# Patient Record
Sex: Female | Born: 1955 | Race: White | Hispanic: No | Marital: Married
Health system: Midwestern US, Academic
[De-identification: ages and names within clinical notes are randomized; demographics above are authoritative.]

---

## 2017-04-11 ENCOUNTER — Encounter: Admit: 2017-04-11 | Discharge: 2017-04-11 | Payer: Private Health Insurance - Indemnity

## 2017-07-25 ENCOUNTER — Encounter: Admit: 2017-07-25 | Discharge: 2017-07-25 | Payer: Private Health Insurance - Indemnity

## 2017-07-25 ENCOUNTER — Encounter: Admit: 2017-07-25 | Discharge: 2017-07-25 | Payer: Private health insurance—other commercial Indemnity

## 2017-07-25 DIAGNOSIS — Z1231 Encounter for screening mammogram for malignant neoplasm of breast: Principal | ICD-10-CM

## 2017-07-25 DIAGNOSIS — Z171 Estrogen receptor negative status [ER-]: ICD-10-CM

## 2017-07-25 DIAGNOSIS — N200 Calculus of kidney: ICD-10-CM

## 2017-07-25 DIAGNOSIS — J302 Other seasonal allergic rhinitis: ICD-10-CM

## 2017-07-25 DIAGNOSIS — C50911 Malignant neoplasm of unspecified site of right female breast: ICD-10-CM

## 2017-07-25 DIAGNOSIS — E119 Type 2 diabetes mellitus without complications: ICD-10-CM

## 2017-07-25 DIAGNOSIS — M549 Dorsalgia, unspecified: ICD-10-CM

## 2017-07-25 DIAGNOSIS — C50919 Malignant neoplasm of unspecified site of unspecified female breast: Principal | ICD-10-CM

## 2018-10-27 ENCOUNTER — Encounter: Admit: 2018-10-27 | Discharge: 2018-10-27

## 2018-10-30 ENCOUNTER — Encounter: Admit: 2018-10-30 | Discharge: 2018-10-30

## 2018-10-30 DIAGNOSIS — Z171 Estrogen receptor negative status [ER-]: Secondary | ICD-10-CM

## 2018-10-30 DIAGNOSIS — C50911 Malignant neoplasm of unspecified site of right female breast: Secondary | ICD-10-CM

## 2018-10-30 DIAGNOSIS — M25559 Pain in unspecified hip: Secondary | ICD-10-CM

## 2018-10-30 DIAGNOSIS — K2270 Barrett's esophagus without dysplasia: Secondary | ICD-10-CM

## 2018-10-30 DIAGNOSIS — Z1231 Encounter for screening mammogram for malignant neoplasm of breast: Secondary | ICD-10-CM

## 2018-10-30 DIAGNOSIS — R200 Anesthesia of skin: Secondary | ICD-10-CM

## 2018-10-30 DIAGNOSIS — E119 Type 2 diabetes mellitus without complications: Secondary | ICD-10-CM

## 2018-10-30 NOTE — Patient Instructions
Team Members:  Dr. Priyanka Sharma, MD  Jaimie Heldstab, APRN  Lisa Her, RN  Garrell Flagg, RN    Contact information:   Office phone: [phone]  Office fax: [phone]  After hours phone: [phone]    Address:  [address], [address]  [place], [place] [address]

## 2018-10-30 NOTE — Progress Notes
10/30/2018    Shelley Powell is a 63 y.o. female.    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   STAGE: T3N0Mx, high grade metaplastic breast cancer     Surgeon:  Dr. Fredricka Powell    Oncology History: 63 year old perimenopausal female states she initially felt a mass in her right breast during the holiday's of 2009 and brought it to the attention of her PCP. She had a mammogram and then a ultrasound read as BIRAD 4. 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 She denies any nipple discharge, skin changes, fatigue, or any other complaints. She was then seen by Dr Shelley Powell and a 10 cm mass was found in her right breast. She underwent right MRM ( with TE placement ) and sentinel LN sampling on 05/21/08. Pathology showed 5.3cm x 4.8cm x 3.9cm poorly differentiated cancer, with chondroid and osseous differentiation. All margins were clear. No angiolymphatic invasion, 0/2 sentinel LNs were negative. ER/PR negative, Ki-67 80%, EGFR 100%, HER 2 negative by FISH. Adjuvant chemotherapy with Adriamycin and Cytoxan dose-dense was initiated on 06/19/2008 and completed 08/01/2008. Weekly Taxol 08/22/2008 and completed 11/07/2008. Has completed adjuvant XRT under the direction of Dr. Kendal Hymen Powell (01/10/2009).     PRESENT THERAPY: Observation     HPI  Shelley Powell presents today for follow up. She reports feeling well.      Review of Systems   Constitutional: Negative for fatigue and fever.   HENT: Negative for trouble swallowing.    Eyes: Negative.  Negative for visual disturbance.   Respiratory: Negative for cough, chest tightness and shortness of breath.    Cardiovascular: Negative for chest pain.   Gastrointestinal: Negative for abdominal pain, constipation, diarrhea, nausea and vomiting.   Genitourinary: Negative.  Negative for difficulty urinating.   Musculoskeletal: Positive for arthralgias (hip, stable). Negative for back pain.   Skin: Negative.  Negative for rash and wound.   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 Negative for dizziness, weakness and headaches.   Hematological: Negative.  Negative for adenopathy.   Psychiatric/Behavioral: Negative.  Negative for dysphoric mood. The patient is not nervous/anxious.        Current Medication List:         ??? glipiZIDE (GLUCOTROL) 10 mg tablet Take 10 mg by mouth twice daily.   ??? hydroCHLOROthiazide (HYDRODIURIL) 12.5 mg tablet Take 12.5 mg by mouth every morning.   ??? losartan (COZAAR) 50 mg tablet Take 50 mg by mouth daily.   ??? omeprazole DR (PRILOSEC) 40 mg capsule Take 40 mg by mouth daily before breakfast.   ??? pravastatin (PRAVACHOL) 40 mg tablet Take 40 mg by mouth daily.         Vitals:    10/30/18 1122   BP: 120/75   Pulse: 86   Resp: 16   Temp: 36.8 ???C (98.2 ???F)   SpO2: 98%       Objective     Body mass index is 33.51 kg/m???.     Pain Rating:   0       Pain Addressed:  N/A    ECOG performance status is 0, Fully active, able to carry on all pre-disease performance without restriction.Marland Kitchen    Physical Exam   Constitutional: She is oriented to person, place, and time. She appears well-developed and well-nourished. No distress.   HENT:  Head: Normocephalic and atraumatic.   Mouth/Throat: Oropharynx is clear and moist.   Eyes: Pupils are equal, round, and reactive to light. Conjunctivae are normal. Right eye exhibits no discharge. Left eye exhibits no discharge. No scleral icterus.   Neck: Normal range of motion. Neck supple. No JVD present.   Cardiovascular: Normal rate, regular rhythm and normal heart sounds.   No murmur heard.  Pulmonary/Chest: Effort normal and breath sounds normal. No respiratory distress. She has no wheezes.       Abdominal: Soft. She exhibits no distension. There is no abdominal tenderness.   Musculoskeletal: Normal range of motion.         General: No tenderness or edema.   Lymphadenopathy:     She has no cervical adenopathy. She has no axillary adenopathy.        Right: No supraclavicular adenopathy present.        Left: No supraclavicular adenopathy present.   Neurological: She is alert and oriented to person, place, and time. No cranial nerve deficit. Coordination normal.   Skin: Skin is warm and dry. No rash noted. No erythema.   Psychiatric: She has a normal mood and affect. Her behavior is normal. Judgment and thought content normal.   Vitals reviewed.       Breast Imaging:  Bilateral screen mamm TOMO 07/14/15: BIRAD 1  Left screening mamm 07/19/16: BIRAD 1  Left Screening Mammo 07/25/17: BIRAD 1  Left Screening Mammogram 10/30/18: BIRAD 1           Assessment and Plan:   1. T3N0M0 Metaplastic right breast CA; S/P mastectomy/TE. Completed adjuvant A/C followed by Weekly Taxol x 12 cycles (completed 11/07/2008). Patient then completed radiation therapy. NED, continue observation.      2. BRCA and BART testing negative. She is participating in the [facility] registry.     3. DM: Currently on glipizide.  Is followed by [facility].    4.  Colonoscopy:  Done 08/2015, repeat in 10 years.     5. 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 She is on a PPI and follows with GI in [place].  Plans to have repeat EDG this year, locally.     6.Reviewed signs to watch for that could indicate a local or distant recurrence. Patient will call our office with any new signs.     7. Current mammogram BIRAD 1, repeat in 1 year.     8. Continue follow up with PCP for HTN, HLD.     RTC in 1 year with left mammogram      Francie Massing, APRN-NP    Collaborating MD: Osborn Coho

## 2019-02-01 ENCOUNTER — Encounter: Admit: 2019-02-01 | Discharge: 2019-02-01 | Payer: Private Health Insurance - Indemnity

## 2019-02-01 ENCOUNTER — Ambulatory Visit: Admit: 2019-02-01 | Discharge: 2019-02-01 | Payer: Private Health Insurance - Indemnity

## 2019-02-01 DIAGNOSIS — R42 Dizziness and giddiness: Secondary | ICD-10-CM

## 2019-08-17 IMAGING — CT ABDOMEN_PELVIS WO(Adult)
2 of 3 series · 13 of 46 positions shown, 15 images · non-contrast
Comparison: none

[Series 2: abdomen_pelvis ax 3.00 br40 s3 · axial · 0.68mm/px · z∈[+1171,+1597]mm · 10 of 160 slices shown, 12 images]
[im 11/160  soft-tissue]
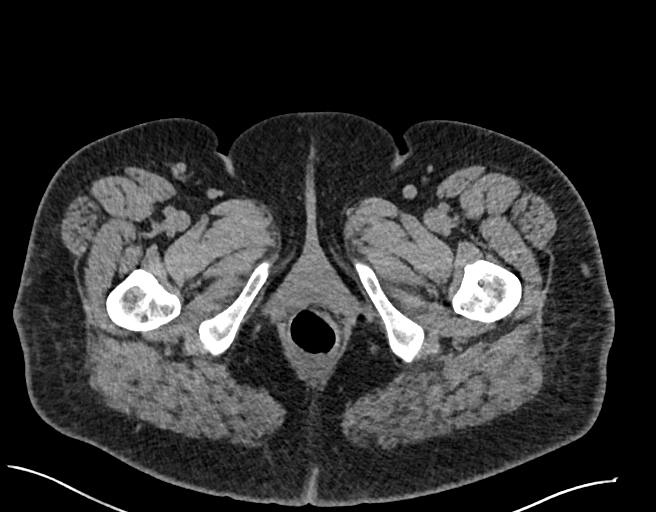
[im 11/160  bone]
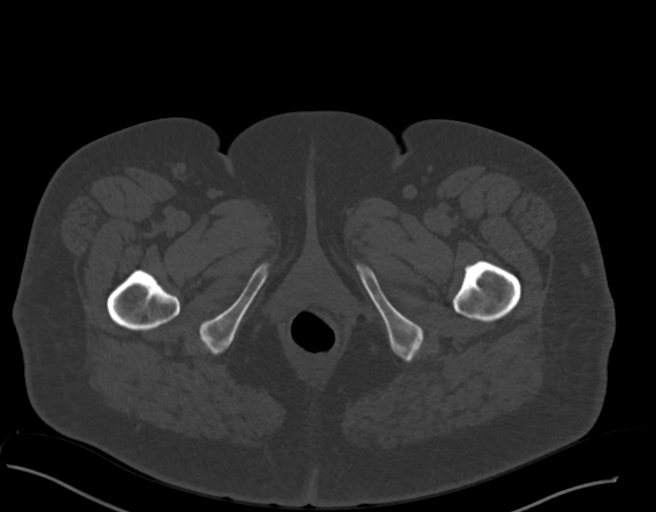
[im 26/160  soft-tissue]
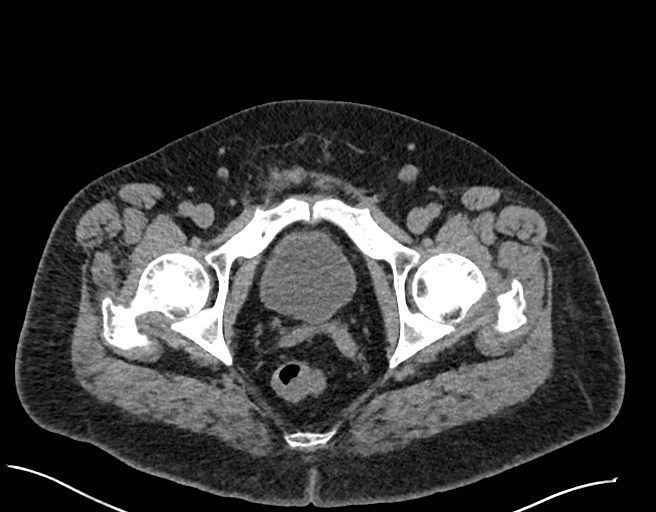
[im 42/160  soft-tissue]
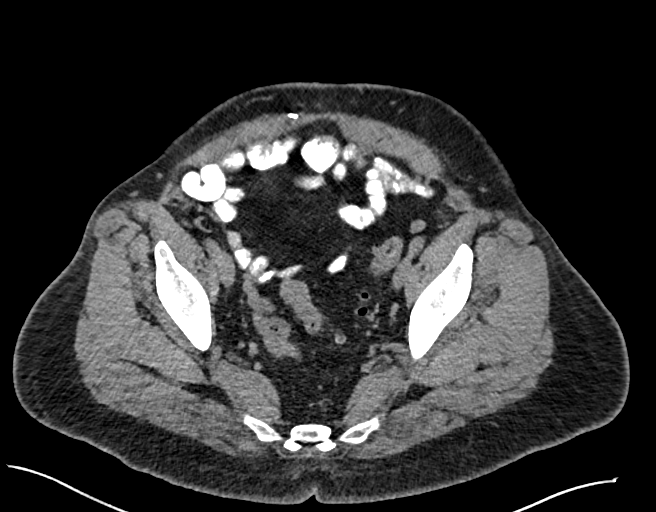
[im 57/160  soft-tissue]
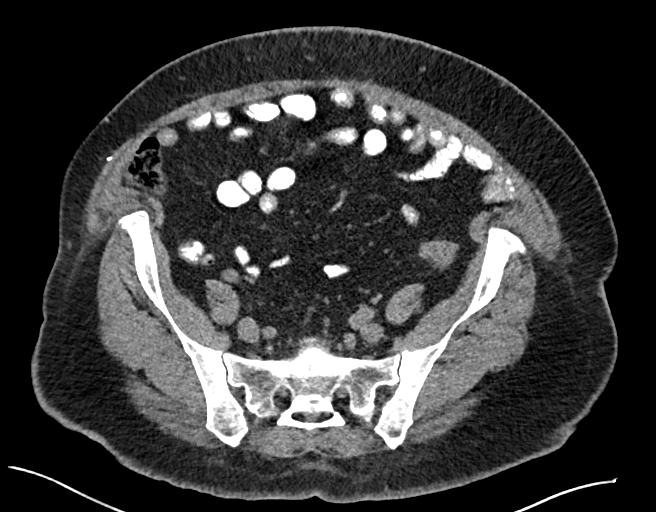
[im 72/160  soft-tissue]
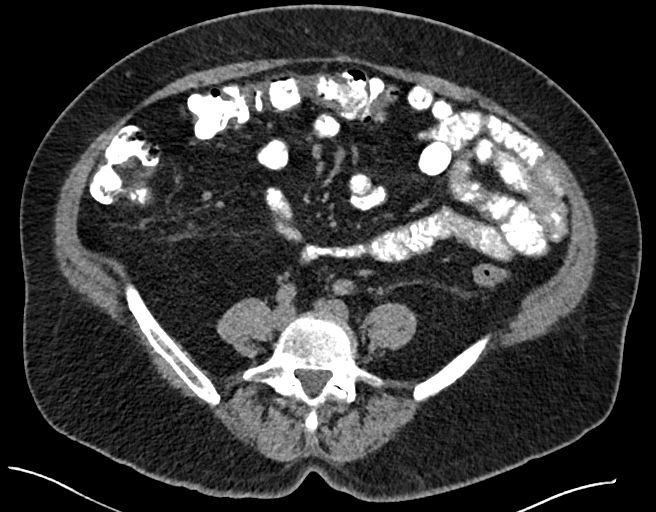
[im 88/160  soft-tissue]
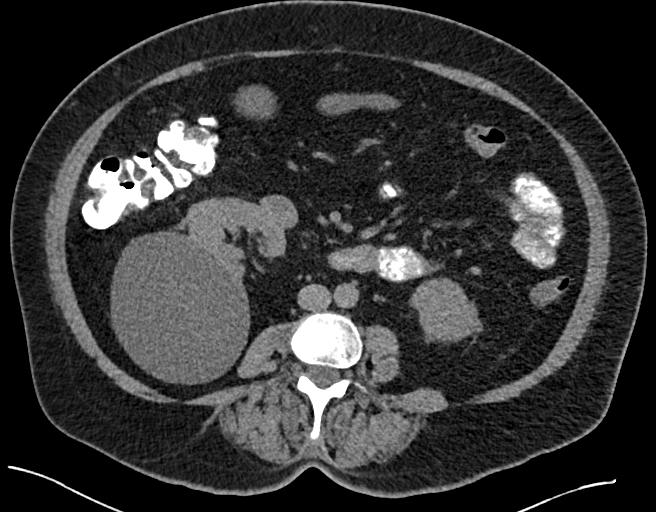
[im 103/160  soft-tissue]
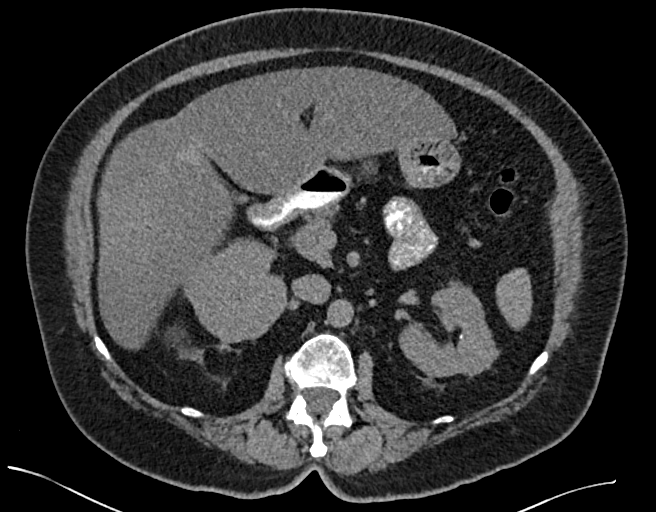
[im 118/160  soft-tissue]
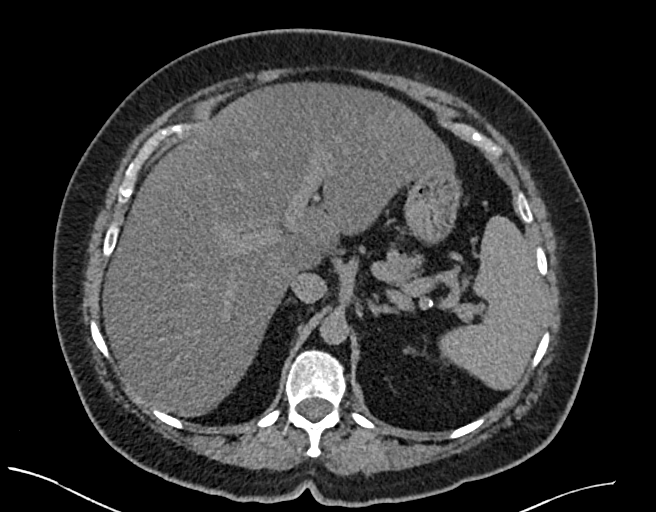
[im 134/160  soft-tissue]
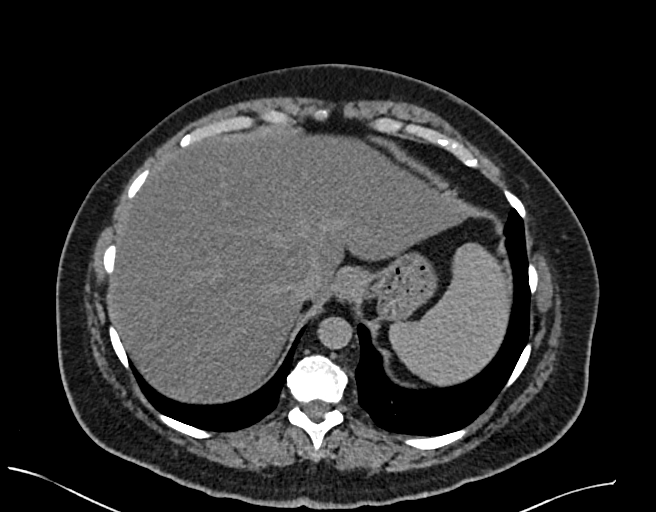
[im 134/160  bone]
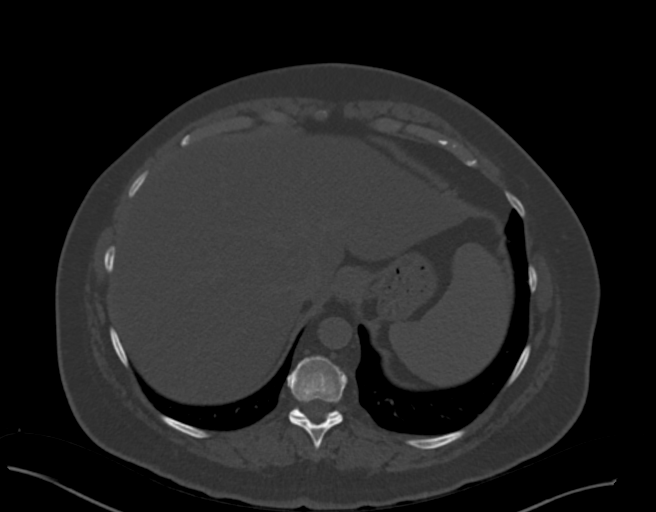
[im 149/160  soft-tissue]
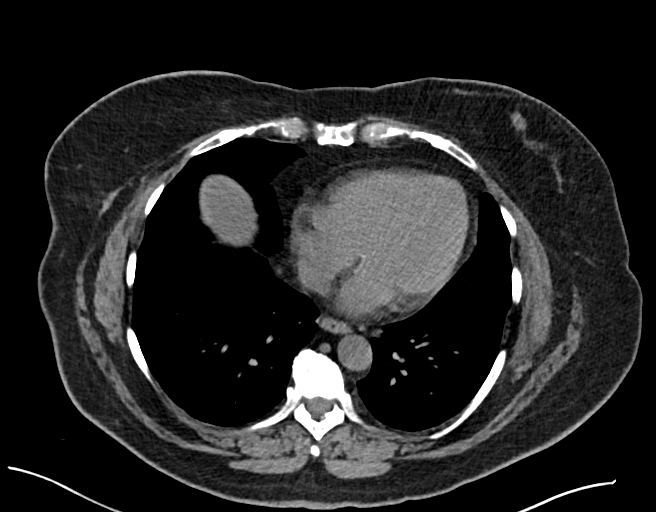

[Series 4: abdomen_pelvis cor 3.00 br40 s3 · coronal · 0.87mm/px · 3 of 109 slices shown]
[im 37/109  soft-tissue]
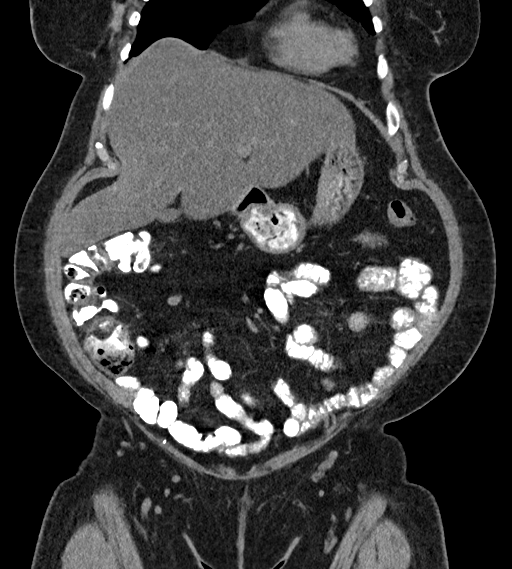
[im 49/109  soft-tissue]
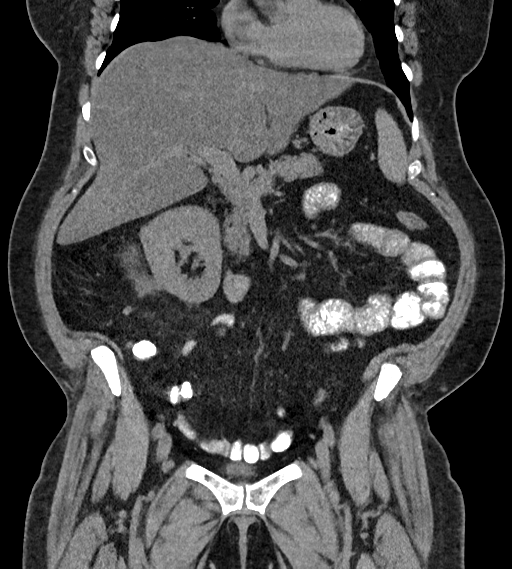
[im 61/109  soft-tissue]
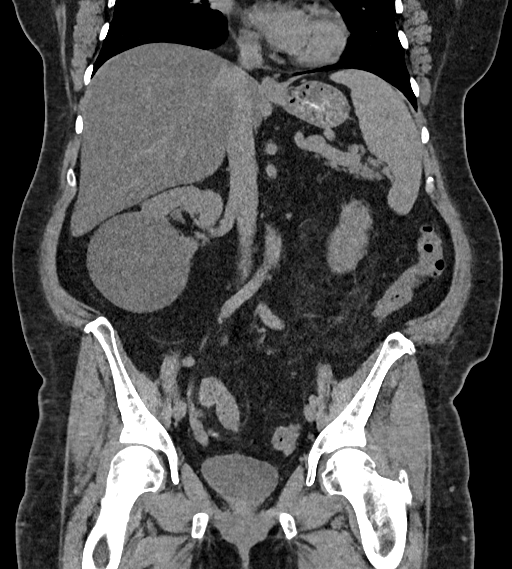

[13 of 46 positions shown; findings below may reference images not displayed]

EXAM

CT abdomen and pelvis with  oral contrast

INDICATION

left sided pain and diarrhea X 2 weeks
50 ML OF K1V8GNN ORALLY. HB

FINDINGS

All CT scans at this facility use dose modulation, iterative reconstruction, and/or weight based
dosing when appropriate to reduce radiation dose to as low as reasonably achievable. In the past 12
month, there have been 0 CT scans in 0 nuclear medicine myocardial perfusion scans.

The lung bases are clear.

There is fatty infiltration of the liver with no focal liver lesion. The liver measures 19.9 cm in
length.

The spleen measures 13.2 cm in length.

There is no abnormality of the pancreas or adrenals. The left kidney appears normal.

A large smoothly marginated low density cyst involving the posterior and inferior portion of the
right kidney, slightly displaces the right kidney anteriorly. This measures 10.6 x 8.8 by 8.9 cm in
diameter.

There is no hydronephrosis.

There is no adenopathy.

There is no bowel obstruction.

The appendix appears normal.

There are scattered diverticula of the descending and sigmoid colon. No inflammatory changes
identified.

The bladder contour is normal.

There is no acute bone lesion.

IMPRESSION

There is fatty infiltration liver. There is mild a pattern splenomegaly. There is a large right
renal cortical cyst displacing the right kidney anteriorly. The appendix is normal.

There is mild diverticulosis of the descending and sigmoid colon.

There is no obstruction.

Tech Notes:

C/O INTERMITTENT NAUSEA SINCE February 2019. DIARRHEA X 12 DAYS. LT SIDE ABD PAIN. H/O HYSTER. PT HAD 50
ML OF K1V8GNN ORALLY. HB

## 2019-10-30 ENCOUNTER — Encounter: Admit: 2019-10-30 | Discharge: 2019-10-30 | Payer: Commercial Managed Care - HMO

## 2019-10-31 ENCOUNTER — Encounter: Admit: 2019-10-31 | Discharge: 2019-10-31 | Payer: Commercial Managed Care - HMO

## 2019-10-31 ENCOUNTER — Ambulatory Visit: Admit: 2019-10-31 | Discharge: 2019-10-31 | Payer: Commercial Managed Care - HMO

## 2019-10-31 DIAGNOSIS — E119 Type 2 diabetes mellitus without complications: Secondary | ICD-10-CM

## 2019-10-31 DIAGNOSIS — M549 Dorsalgia, unspecified: Secondary | ICD-10-CM

## 2019-10-31 DIAGNOSIS — N200 Calculus of kidney: Secondary | ICD-10-CM

## 2019-10-31 DIAGNOSIS — J302 Other seasonal allergic rhinitis: Secondary | ICD-10-CM

## 2019-10-31 DIAGNOSIS — C50919 Malignant neoplasm of unspecified site of unspecified female breast: Secondary | ICD-10-CM

## 2019-10-31 DIAGNOSIS — C50911 Malignant neoplasm of unspecified site of right female breast: Secondary | ICD-10-CM

## 2019-10-31 DIAGNOSIS — Z1231 Encounter for screening mammogram for malignant neoplasm of breast: Secondary | ICD-10-CM

## 2019-10-31 NOTE — Patient Instructions
Team Members:  Dr. Priyanka Sharma, MD  Jaimie Heldstab, APRN  Jilliann Subramanian, RN  Julie French, RN    Contact information:   Office phone: [phone]  Office fax: [phone]  After hours phone: [phone]    Address:  [address], [address]  [place], [place] [address]

## 2019-10-31 NOTE — Progress Notes
10/31/2019    Shelley Powell is a 64 y.o. female.    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   STAGE: T3N0Mx, high grade metaplastic breast cancer     Surgeon:  Dr. Fredricka Bonine    Oncology History: 64 year old perimenopausal female states she initially felt a mass in her right breast during the holiday's of 2009 and brought it to the attention of her PCP. She had a mammogram and then a ultrasound read as BIRAD 4. 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 She denies any nipple discharge, skin changes, fatigue, or any other complaints. She was then seen by Dr Fredricka Bonine and a 10 cm mass was found in her right breast. She underwent right MRM ( with TE placement ) and sentinel LN sampling on 05/21/08. Pathology showed 5.3cm x 4.8cm x 3.9cm poorly differentiated cancer, with chondroid and osseous differentiation. All margins were clear. No angiolymphatic invasion, 0/2 sentinel LNs were negative. ER/PR negative, Ki-67 80%, EGFR 100%, HER 2 negative by FISH. Adjuvant chemotherapy with Adriamycin and Cytoxan dose-dense was initiated on 06/19/2008 and completed 08/01/2008. Weekly Taxol 08/22/2008 and completed 11/07/2008. Has completed adjuvant XRT under the direction of Dr. Kendal Hymen Goins (01/10/2009).     PRESENT THERAPY: Observation     HPI  Shelley Powell presents today for follow up. She reports feeling well.  She really enjoys swimming for exercise and has started at the [facility].     Review of Systems   Constitutional: Negative for fatigue and fever.   HENT: Negative for trouble swallowing.    Eyes: Negative.  Negative for visual disturbance.   Respiratory: Negative for cough, chest tightness and shortness of breath.    Cardiovascular: Negative for chest pain.   Gastrointestinal: Negative for abdominal pain, constipation, diarrhea, nausea and vomiting.   Genitourinary: Negative.  Negative for difficulty urinating. Musculoskeletal: Positive for arthralgias (hip, stable). Negative for back pain and neck stiffness.   Skin: Negative.  Negative for rash and wound.   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 Negative for dizziness, weakness and headaches.   Hematological: Negative.  Negative for adenopathy.   Psychiatric/Behavioral: Negative.  Negative for dysphoric mood. The patient is not nervous/anxious.        Current Medication List:         ? glipiZIDE (GLUCOTROL) 10 mg tablet Take 10 mg by mouth twice daily.   ? hydroCHLOROthiazide (HYDRODIURIL) 12.5 mg tablet Take 12.5 mg by mouth every morning.   ? losartan (COZAAR) 50 mg tablet Take 50 mg by mouth daily.   ? losartan-hydrochlorothiazide (HYZAAR) 50-12.5 mg tablet Take 1 tablet by mouth every morning. 1 po daily   ? omeprazole DR (PRILOSEC) 40 mg capsule Take 40 mg by mouth daily before breakfast.   ? pravastatin (PRAVACHOL) 40 mg tablet Take 40 mg by mouth daily.         Vitals:    10/31/19 1300   BP: 116/63   Pulse: 92   Resp: 16   Temp: 36.8 ?C (98.3 ?F)   SpO2: 97%       Objective     Body mass index is 34.36 kg/m?Marland Kitchen     Pain Rating:   0       Pain Addressed:  N/A    ECOG performance status is 0, Fully active, able to carry on all  pre-disease performance without restriction.Marland Kitchen    Physical Exam   Constitutional: She is oriented to person, place, and time. She appears well-developed and well-nourished. No distress.   HENT:   Head: Normocephalic and atraumatic.   Mouth/Throat: Oropharynx is clear and moist.   Eyes: Pupils are equal, round, and reactive to light. Conjunctivae are normal. Right eye exhibits no discharge. Left eye exhibits no discharge. No scleral icterus.   Neck: No JVD present.   Cardiovascular: Normal rate, regular rhythm and normal heart sounds.   No murmur heard.  Pulmonary/Chest: Effort normal and breath sounds normal. No respiratory distress. She has no wheezes.       Abdominal: Soft. She exhibits no distension. There is no abdominal tenderness.   Musculoskeletal:         General: No tenderness or edema. Normal range of motion.      Cervical back: Normal range of motion and neck supple.   Lymphadenopathy:     She has no cervical adenopathy.     She has no axillary adenopathy.        Right: No supraclavicular adenopathy present.        Left: No supraclavicular adenopathy present.   Neurological: She is alert and oriented to person, place, and time. No cranial nerve deficit. Coordination normal.   Skin: Skin is warm and dry. No rash noted. No erythema.   Psychiatric: She has a normal mood and affect. Her behavior is normal. Judgment and thought content normal.   Vitals reviewed.       Breast Imaging:  Bilateral screen mamm TOMO 07/14/15: BIRAD 1  Left screening mamm 07/19/16: BIRAD 1  Left Screening Mammo 07/25/17: BIRAD 1  Left Screening Mammogram 10/30/18: BIRAD 1  Left Screening Mammogram 10/31/19:  BIRAD 1          Assessment and Plan:   1. T3N0M0 Metaplastic right breast CA; S/P mastectomy/TE. Completed adjuvant A/C followed by Weekly Taxol x 12 cycles (completed 11/07/2008). Patient then completed radiation therapy. NED, continue observation.      2. BRCA and BART testing negative. She is participating in the [facility] registry.     3. DM: Currently on glipizide.  Is followed by [facility].    4.  Colonoscopy:  Done 08/2015, repeat in 10 years.     5. 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 She is on a PPI and follows with GI in [place].  She gets EGD's every 3 years and is due. She is following with GI locally.      6.Reviewed signs to watch for that could indicate a local or distant recurrence. Patient will call our office with any new signs.     7. Current mammogram BIRAD 1, repeat in 1 year.     8. Continue follow up with PCP for HTN, HLD.     RTC in 1 year with left mammogram      Francie Massing, APRN-NP    Collaborating MD: Osborn Coho

## 2020-08-08 ENCOUNTER — Encounter: Admit: 2020-08-08 | Discharge: 2020-08-08 | Payer: Commercial Managed Care - HMO

## 2020-08-08 DIAGNOSIS — N200 Calculus of kidney: Secondary | ICD-10-CM

## 2020-08-08 DIAGNOSIS — M549 Dorsalgia, unspecified: Secondary | ICD-10-CM

## 2020-08-08 DIAGNOSIS — E119 Type 2 diabetes mellitus without complications: Secondary | ICD-10-CM

## 2020-08-08 DIAGNOSIS — J302 Other seasonal allergic rhinitis: Secondary | ICD-10-CM

## 2020-08-08 DIAGNOSIS — C50919 Malignant neoplasm of unspecified site of unspecified female breast: Secondary | ICD-10-CM

## 2020-11-05 ENCOUNTER — Encounter: Admit: 2020-11-05 | Discharge: 2020-11-05 | Payer: Commercial Managed Care - HMO

## 2020-11-05 DIAGNOSIS — E119 Type 2 diabetes mellitus without complications: Secondary | ICD-10-CM

## 2020-11-05 DIAGNOSIS — C50911 Malignant neoplasm of unspecified site of right female breast: Secondary | ICD-10-CM

## 2020-11-05 DIAGNOSIS — C50919 Malignant neoplasm of unspecified site of unspecified female breast: Secondary | ICD-10-CM

## 2020-11-05 DIAGNOSIS — J302 Other seasonal allergic rhinitis: Secondary | ICD-10-CM

## 2020-11-05 DIAGNOSIS — M549 Dorsalgia, unspecified: Secondary | ICD-10-CM

## 2020-11-05 DIAGNOSIS — Z1231 Encounter for screening mammogram for malignant neoplasm of breast: Secondary | ICD-10-CM

## 2020-11-05 DIAGNOSIS — N200 Calculus of kidney: Secondary | ICD-10-CM

## 2020-11-05 NOTE — Progress Notes
11/05/2020    Shelley Powell is a 65 y.o. female.    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   STAGE: T3N0Mx, high grade metaplastic breast cancer     Surgeon:  Dr. Fredricka Bonine    Oncology History: 65 year old perimenopausal female states she initially felt a mass in her right breast during the holiday's of 2009 and brought it to the attention of her PCP. She had a mammogram and then a ultrasound read as BIRAD 4. 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 She denies any nipple discharge, skin changes, fatigue, or any other complaints. She was then seen by Dr Fredricka Bonine and a 10 cm mass was found in her right breast. She underwent right MRM ( with TE placement ) and sentinel LN sampling on 05/21/08. Pathology showed 5.3cm x 4.8cm x 3.9cm poorly differentiated cancer, with chondroid and osseous differentiation. All margins were clear. No angiolymphatic invasion, 0/2 sentinel LNs were negative. ER/PR negative, Ki-67 80%, EGFR 100%, HER 2 negative by FISH. Adjuvant chemotherapy with Adriamycin and Cytoxan dose-dense was initiated on 06/19/2008 and completed 08/01/2008. Weekly Taxol 08/22/2008 and completed 11/07/2008. Has completed adjuvant XRT under the direction of Dr. Kendal Hymen Goins (01/10/2009).     PRESENT THERAPY: Observation     HPI  Ms. Shelley Powell presents today for follow up. She continues to follow with PCP and GI locally. Denies any recent changes.     Review of Systems   Constitutional: Negative for fatigue and fever.   HENT: Negative for trouble swallowing.    Eyes: Negative.  Negative for visual disturbance.   Respiratory: Negative for cough, chest tightness and shortness of breath.    Cardiovascular: Negative for chest pain.   Gastrointestinal: Negative for abdominal pain, constipation, diarrhea, nausea and vomiting.   Genitourinary: Negative.  Negative for difficulty urinating.   Musculoskeletal: Positive for arthralgias (hip, stable). Negative for back pain and neck stiffness.   Skin: Negative.  Negative for rash and wound.   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 Negative for dizziness, weakness and headaches.   Hematological: Negative.  Negative for adenopathy.   Psychiatric/Behavioral: Negative.  Negative for dysphoric mood. The patient is not nervous/anxious.        Current Medication List:         ? glipiZIDE (GLUCOTROL) 10 mg tablet Take 10 mg by mouth twice daily.   ? glipiZIDE CR (GLUCOTROL XL) 10 mg tablet Take 10 mg by mouth daily.   ? hydroCHLOROthiazide (HYDRODIURIL) 12.5 mg tablet Take 12.5 mg by mouth every morning.   ? JARDIANCE 25 mg tablet Take 25 mg by mouth daily.   ? losartan (COZAAR) 50 mg tablet Take 50 mg by mouth daily.   ? losartan-hydrochlorothiazide (HYZAAR) 50-12.5 mg tablet Take 12.5 tablets by mouth daily.   ? losartan-hydrochlorothiazide (HYZAAR) 50-12.5 mg tablet Take 1 tablet by mouth every morning. 1 po daily   ? meloxicam (MOBIC) 15 mg tablet Take 15 mg by mouth as Needed.   ? omeprazole DR (PRILOSEC) 40 mg capsule Take 40 mg by mouth daily.   ? omeprazole DR (PRILOSEC) 40 mg capsule Take 40 mg by mouth daily before breakfast.   ? pravastatin (PRAVACHOL) 40 mg tablet Take 40 mg by mouth daily.   ? pravastatin (PRAVACHOL) 40 mg tablet Take 40 mg by mouth daily.  Vitals:    11/05/20 1259   BP: 110/69   Pulse: 89   Temp: 36.3 ?C (97.3 ?F)   Resp: 19   SpO2: 97%       Objective     Body mass index is 33.95 kg/m?Marland Kitchen     Pain Rating:   0       Pain Addressed:  N/A    ECOG performance status is 0, Fully active, able to carry on all pre-disease performance without restriction.Marland Kitchen    Physical Exam  Vitals reviewed.   Constitutional:       General: She is not in acute distress.     Appearance: She is well-developed.   HENT:      Head: Normocephalic and atraumatic.   Eyes:      General: No scleral icterus.        Right eye: No discharge.         Left eye: No discharge.      Conjunctiva/sclera: Conjunctivae normal.      Pupils: Pupils are equal, round, and reactive to light.   Neck:      Vascular: No JVD.   Cardiovascular:      Rate and Rhythm: Normal rate and regular rhythm.      Heart sounds: Normal heart sounds. No murmur heard.  Pulmonary:      Effort: Pulmonary effort is normal. No respiratory distress.      Breath sounds: Normal breath sounds. No wheezing.   Chest:       Abdominal:      General: There is no distension.   Musculoskeletal:         General: No tenderness. Normal range of motion.      Cervical back: Normal range of motion and neck supple.   Lymphadenopathy:      Cervical: No cervical adenopathy.      Upper Body:      Right upper body: No supraclavicular adenopathy.      Left upper body: No supraclavicular adenopathy.   Skin:     General: Skin is warm and dry.      Coloration: Skin is not pale.      Findings: No erythema or rash.   Neurological:      Mental Status: She is alert and oriented to person, place, and time.      Cranial Nerves: No cranial nerve deficit.      Motor: No weakness.      Coordination: Coordination normal.   Psychiatric:         Behavior: Behavior normal.         Thought Content: Thought content normal.         Judgment: Judgment normal.          Breast Imaging:  Bilateral screen mamm TOMO 07/14/15: BIRAD 1  Left screening mamm 07/19/16: BIRAD 1  Left Screening Mammo 07/25/17: BIRAD 1  Left Screening Mammogram 10/30/18: BIRAD 1  Left Screening Mammogram 10/31/19:  BIRAD 1   Left Screening Mammogram 11/05/20:  BIRAD 1         Assessment and Plan:   1. T3N0M0 Metaplastic right breast CA; S/P mastectomy/TE. Completed adjuvant A/C followed by Weekly Taxol x 12 cycles (completed 11/07/2008). Patient then completed radiation therapy. NED, continue observation.      2. BRCA and BART testing negative.     3. DM: Currently on glipizide. Continue to follow up locally.     4.  Colonoscopy:  Done 08/2015, repeat in 10 years.  5. 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 She is on a PPI and follows with GI in [place].  She reports most recent EGD was normal.     6.Reviewed signs to watch for that could indicate a local or distant recurrence. Patient will call our office with any new signs.     7. Current mammogram BIRAD 1, repeat in 1 year.     8. Continue follow up with PCP for HTN, HLD.     RTC in 1 year with left mammogram      Francie Massing, APRN-NP    Collaborating MD: Osborn Coho

## 2020-11-06 ENCOUNTER — Encounter: Admit: 2020-11-06 | Discharge: 2020-11-06 | Payer: Commercial Managed Care - HMO

## 2021-09-09 ENCOUNTER — Encounter: Admit: 2021-09-09 | Discharge: 2021-09-09 | Payer: Commercial Managed Care - HMO

## 2021-09-14 ENCOUNTER — Encounter: Admit: 2021-09-14 | Discharge: 2021-09-14 | Payer: Commercial Managed Care - HMO

## 2021-11-11 ENCOUNTER — Encounter: Admit: 2021-11-11 | Discharge: 2021-11-11 | Payer: Private Health Insurance - Indemnity

## 2021-11-11 DIAGNOSIS — C50919 Malignant neoplasm of unspecified site of unspecified female breast: Secondary | ICD-10-CM

## 2021-11-11 DIAGNOSIS — J302 Other seasonal allergic rhinitis: Secondary | ICD-10-CM

## 2021-11-11 DIAGNOSIS — E119 Type 2 diabetes mellitus without complications: Secondary | ICD-10-CM

## 2021-11-11 DIAGNOSIS — M549 Dorsalgia, unspecified: Secondary | ICD-10-CM

## 2021-11-11 DIAGNOSIS — N200 Calculus of kidney: Secondary | ICD-10-CM

## 2021-11-11 DIAGNOSIS — Z1231 Encounter for screening mammogram for malignant neoplasm of breast: Secondary | ICD-10-CM

## 2021-11-11 DIAGNOSIS — C50911 Malignant neoplasm of unspecified site of right female breast: Secondary | ICD-10-CM

## 2021-11-11 NOTE — Patient Instructions
Team Members:  Dr. Priyanka Sharma, MD  Jaimie Heldstab, APRN  Jermy Couper, RN  Julie French, RN    Contact information:   Office phone: [phone]  Office fax: [phone]  After hours phone: [phone]    Address:  [address], [address]  [place], [place] [address]

## 2021-11-23 ENCOUNTER — Ambulatory Visit: Admit: 2021-11-23 | Discharge: 2021-11-23 | Payer: Private Health Insurance - Indemnity

## 2021-11-23 ENCOUNTER — Encounter: Admit: 2021-11-23 | Discharge: 2021-11-23 | Payer: Private Health Insurance - Indemnity

## 2021-11-23 DIAGNOSIS — N200 Calculus of kidney: Secondary | ICD-10-CM

## 2021-11-23 DIAGNOSIS — E119 Type 2 diabetes mellitus without complications: Secondary | ICD-10-CM

## 2021-11-23 DIAGNOSIS — J302 Other seasonal allergic rhinitis: Secondary | ICD-10-CM

## 2021-11-23 DIAGNOSIS — Z923 Personal history of irradiation: Secondary | ICD-10-CM

## 2021-11-23 DIAGNOSIS — M549 Dorsalgia, unspecified: Secondary | ICD-10-CM

## 2021-11-23 DIAGNOSIS — E785 Hyperlipidemia, unspecified: Secondary | ICD-10-CM

## 2021-11-23 DIAGNOSIS — C50919 Malignant neoplasm of unspecified site of unspecified female breast: Secondary | ICD-10-CM

## 2021-11-25 ENCOUNTER — Encounter: Admit: 2021-11-25 | Discharge: 2021-11-25 | Payer: Private Health Insurance - Indemnity

## 2021-11-26 ENCOUNTER — Encounter: Admit: 2021-11-26 | Discharge: 2021-11-26 | Payer: Private Health Insurance - Indemnity

## 2021-11-26 DIAGNOSIS — E785 Hyperlipidemia, unspecified: Secondary | ICD-10-CM

## 2021-11-26 DIAGNOSIS — C50919 Malignant neoplasm of unspecified site of unspecified female breast: Secondary | ICD-10-CM

## 2021-11-26 DIAGNOSIS — J302 Other seasonal allergic rhinitis: Secondary | ICD-10-CM

## 2021-11-26 DIAGNOSIS — Z923 Personal history of irradiation: Secondary | ICD-10-CM

## 2021-11-26 DIAGNOSIS — N200 Calculus of kidney: Secondary | ICD-10-CM

## 2021-11-26 DIAGNOSIS — M549 Dorsalgia, unspecified: Secondary | ICD-10-CM

## 2021-11-26 DIAGNOSIS — E119 Type 2 diabetes mellitus without complications: Secondary | ICD-10-CM

## 2021-12-07 ENCOUNTER — Encounter: Admit: 2021-12-07 | Discharge: 2021-12-07 | Payer: Private Health Insurance - Indemnity

## 2022-05-24 ENCOUNTER — Encounter: Admit: 2022-05-24 | Discharge: 2022-05-24 | Payer: Private Health Insurance - Indemnity

## 2022-05-25 ENCOUNTER — Encounter: Admit: 2022-05-25 | Discharge: 2022-05-25 | Payer: Private Health Insurance - Indemnity

## 2022-09-14 ENCOUNTER — Encounter: Admit: 2022-09-14 | Discharge: 2022-09-14 | Payer: 59

## 2022-11-03 ENCOUNTER — Encounter: Admit: 2022-11-03 | Discharge: 2022-11-03 | Payer: 59

## 2022-11-03 ENCOUNTER — Ambulatory Visit: Admit: 2022-11-03 | Discharge: 2022-11-04 | Payer: 59

## 2022-11-03 DIAGNOSIS — M549 Dorsalgia, unspecified: Secondary | ICD-10-CM

## 2022-11-03 DIAGNOSIS — R1032 Left lower quadrant pain: Secondary | ICD-10-CM

## 2022-11-03 DIAGNOSIS — E785 Hyperlipidemia, unspecified: Secondary | ICD-10-CM

## 2022-11-03 DIAGNOSIS — H547 Unspecified visual loss: Secondary | ICD-10-CM

## 2022-11-03 DIAGNOSIS — M1990 Unspecified osteoarthritis, unspecified site: Secondary | ICD-10-CM

## 2022-11-03 DIAGNOSIS — C50919 Malignant neoplasm of unspecified site of unspecified female breast: Secondary | ICD-10-CM

## 2022-11-03 DIAGNOSIS — R1115 Cyclical vomiting syndrome unrelated to migraine: Secondary | ICD-10-CM

## 2022-11-03 DIAGNOSIS — N200 Calculus of kidney: Secondary | ICD-10-CM

## 2022-11-03 DIAGNOSIS — I10 Essential (primary) hypertension: Secondary | ICD-10-CM

## 2022-11-03 DIAGNOSIS — R197 Diarrhea, unspecified: Secondary | ICD-10-CM

## 2022-11-03 DIAGNOSIS — K219 Gastro-esophageal reflux disease without esophagitis: Secondary | ICD-10-CM

## 2022-11-03 DIAGNOSIS — J302 Other seasonal allergic rhinitis: Secondary | ICD-10-CM

## 2022-11-03 DIAGNOSIS — T7840XA Allergy, unspecified, initial encounter: Secondary | ICD-10-CM

## 2022-11-03 DIAGNOSIS — Z923 Personal history of irradiation: Secondary | ICD-10-CM

## 2022-11-03 DIAGNOSIS — E119 Type 2 diabetes mellitus without complications: Secondary | ICD-10-CM

## 2022-11-03 MED ORDER — METAMUCIL (WITH SUGAR) 3.4 GRAM PO PWPK
1 | PACK | Freq: Two times a day (BID) | ORAL | 11 refills | Status: AC
Start: 2022-11-03 — End: ?

## 2022-11-03 MED ORDER — LOPERAMIDE 2 MG PO CAP
2 mg | ORAL_CAPSULE | Freq: Four times a day (QID) | ORAL | 11 refills | 23.00000 days | Status: AC | PRN
Start: 2022-11-03 — End: ?

## 2022-11-03 MED ORDER — HYOSCYAMINE SULFATE 0.125 MG SL SUBL
125 ug | ORAL_TABLET | Freq: Four times a day (QID) | SUBLINGUAL | 11 refills | Status: AC | PRN
Start: 2022-11-03 — End: ?

## 2022-11-03 NOTE — Progress Notes
Telehealth Visit Note    Date of Service: 11/03/2022    Subjective:           Shelley Powell is a 67 y.o. female.    History of Present Illness    This is a 67 year old female who has been referred to [place] for episodic nausea and vomiting.  Patient started having symptoms in 12/21.  She would have acute onset nausea vomiting along with diarrhea and some abdominal discomfort.  Symptoms would last for 12 to 16 hours.  During this time she would have severe vomiting.  Following this the symptoms would resolve and then she would be symptom-free for about 4 to 6 weeks.  The symptoms continued in the same cyclical fashion up to 02/24.  Since then she has not had any nausea vomiting and only complains of some left lower quadrant pain as well as diarrhea once a month.  During these days she has 3-6 bowel movements per day that are loose and improved with Imodium.  Her appetite is good and weight is stable.  She denies any history of migraine and does not smoke marijuana.  Upper endoscopy in 2021 was normal.  In the past she had a questionable diagnosis of Barrett's esophagus.  However on the last upper endoscopy and biopsies there was no evidence of intestinal metaplasia.  Last colonoscopy was in 10/21 and as per the patient was normal.  She had a CT scan in 2023 that did not reveal any significant finding except for bilateral renal cysts and mild splenomegaly.    Social history-she is married, has 8 children, does not smoke or drink and does not smoke marijuana.  She works as a authorization clot at [employer] brothers had prostate cancer, mother had polycythemia vera             Active Ambulatory Problems     Diagnosis Date Noted    S/P Right Mastectomy-Surgical Oncology 05/24/2008    BRCA negative 10/23/2010    Breast cancer, right (HCC) 07/04/2013    Type 2 diabetes mellitus without complication (HCC) 09/17/2013    Pure hypercholesterolemia 12/20/2013    Essential hypertension, benign 12/20/2013    Renal cyst, acquired, right      Resolved Ambulatory Problems     Diagnosis Date Noted    Nausea & vomiting 05/24/2008     Past Medical History:   Diagnosis Date    Acid reflux 08/2015    Allergy     Arthritis 2010    Back pain     Breast cancer (HCC) 04/30/2007    Dyslipidemia     Essential hypertension     GERD (gastroesophageal reflux disease) 2017    History of radiation therapy     Hyperlipemia Unknown    Nephrolithiasis 03/29/2004    Seasonal rhinitis     Type II diabetes mellitus (HCC)     Vision decreased 1973       Social History     Socioeconomic History    Marital status: Married   Occupational History    Occupation: Education officer, environmental   Tobacco Use    Smoking status: Never    Smokeless tobacco: Never   Substance and Sexual Activity    Alcohol use: No    Drug use: No    Sexual activity: Yes     Partners: Male     Birth control/protection: Surgical       Family History   Problem Relation Name Age of  Onset    Heart Failure Father Merlyn Albert     Other Father Merlyn Albert         COPD    Heart Disease Father Merlyn Albert     Hypertension Father Talmage Nap Brother         Allergies   Allergen Reactions    Zithromax [Azithromycin] SHORTNESS OF BREATH       Patient Active Problem List    Diagnosis Date Noted    Renal cyst, acquired, right     Pure hypercholesterolemia 12/20/2013    Essential hypertension, benign 12/20/2013    Type 2 diabetes mellitus without complication (HCC) 09/17/2013    Breast cancer, right (HCC) 07/04/2013    BRCA negative 10/23/2010    S/P Right Mastectomy-Surgical Oncology 05/24/2008         Objective:          aspirin EC (ASPIR-LOW) 81 mg tablet Take one tablet by mouth daily.    glipiZIDE CR (GLUCOTROL XL) 10 mg tablet Take one tablet by mouth daily.    hyoscyamine sulfate (LEVSIN/SL) 0.125 mg sublingual tablet Place one tablet under tongue four times daily as needed for Cramps.    JARDIANCE 25 mg tablet Take one tablet by mouth daily.    loperamide (ANTI-DIARRHEAL (LOPERAMIDE)) 2 mg capsule Take one capsule by mouth four times daily as needed for Diarrhea.    losartan-hydrochlorothiazide (HYZAAR) 50-12.5 mg tablet Take 12.5 tablets by mouth daily.    omeprazole DR (PRILOSEC) 40 mg capsule Take one capsule by mouth daily.    pravastatin (PRAVACHOL) 40 mg tablet Take one tablet by mouth daily.    psyllium husk (with sugar) (METAMUCIL (WITH SUGAR)) 3.4 gram packet Take one packet by mouth twice daily.    SITagliptin phosphate (JANUVIA) 50 mg tablet           Telehealth Patient Reported Vitals       Row Name 11/03/22 0718                Weight: 98 kg (216 lb)        Height: 172.7 cm (5' 8)        Pain Score: Zero                      Telehealth Body Mass Index: 32.84 at 11/03/2022  6:08 PM    Physical Exam  Deferred as this was a telehealth visit       Assessment and Plan:  This is a 67 year old female who has been referred to [place] for episodic nausea and vomiting.  Patient started having symptoms in 12/21.  She would have acute onset nausea vomiting along with diarrhea and some abdominal discomfort.  Symptoms would last for 12 to 16 hours.  During this time she would have severe vomiting.  Following this the symptoms would resolve and then she would be symptom-free for about 4 to 6 weeks.  The symptoms continued in the same cyclical fashion up to 02/24.  Since then she has not had any nausea vomiting and only complains of some left lower quadrant pain as well as diarrhea once a month.  During these days she has 3-6 bowel movements per day that are loose and improved with Imodium.  Her appetite is good and weight is stable.  She denies any history of migraine and does not smoke marijuana.  Upper endoscopy in 2021 was normal.  In the past she had a questionable diagnosis of Barrett's esophagus.  However on the last upper endoscopy and biopsies there was no evidence of intestinal metaplasia.  Last colonoscopy was in 10/21 and as per the patient was normal.  She had a CT scan in 2023 that did not reveal any significant finding except for bilateral renal cysts and mild splenomegaly.    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  However encouragingly she has not had symptoms for the last 6 months.  We discussed the management of this in detail.  Given that she has been symptom-free for the last 6 months I am hesitant to start her on amitriptyline.  I advised her to call us if she has another episode and then we can initiate her amitriptyline.  Will start her on Metamucil and Imodium for the diarrhea as well as Levsin for the left lower quadrant cramping abdominal pain.  Follow-up in 3 months  Thank you for allowing [place] to participate in her care and please feel free to call us if you have any question.                           45 minutes spent on this patient's encounter with counseling and coordination of care taking >50% of the visit.

## 2022-11-16 ENCOUNTER — Encounter: Admit: 2022-11-16 | Discharge: 2022-11-16 | Payer: 59

## 2022-11-16 ENCOUNTER — Ambulatory Visit: Admit: 2022-11-16 | Discharge: 2022-11-17 | Payer: 59

## 2022-11-16 DIAGNOSIS — Z9221 Personal history of antineoplastic chemotherapy: Secondary | ICD-10-CM

## 2022-11-16 DIAGNOSIS — E119 Type 2 diabetes mellitus without complications: Secondary | ICD-10-CM

## 2022-11-16 DIAGNOSIS — M1990 Unspecified osteoarthritis, unspecified site: Secondary | ICD-10-CM

## 2022-11-16 DIAGNOSIS — E785 Hyperlipidemia, unspecified: Secondary | ICD-10-CM

## 2022-11-16 DIAGNOSIS — Z171 Estrogen receptor negative status [ER-]: Secondary | ICD-10-CM

## 2022-11-16 DIAGNOSIS — K219 Gastro-esophageal reflux disease without esophagitis: Secondary | ICD-10-CM

## 2022-11-16 DIAGNOSIS — Z923 Personal history of irradiation: Secondary | ICD-10-CM

## 2022-11-16 DIAGNOSIS — N200 Calculus of kidney: Secondary | ICD-10-CM

## 2022-11-16 DIAGNOSIS — H547 Unspecified visual loss: Secondary | ICD-10-CM

## 2022-11-16 DIAGNOSIS — J302 Other seasonal allergic rhinitis: Secondary | ICD-10-CM

## 2022-11-16 DIAGNOSIS — C50919 Malignant neoplasm of unspecified site of unspecified female breast: Secondary | ICD-10-CM

## 2022-11-16 DIAGNOSIS — T7840XA Allergy, unspecified, initial encounter: Secondary | ICD-10-CM

## 2022-11-16 DIAGNOSIS — C50911 Malignant neoplasm of unspecified site of right female breast: Secondary | ICD-10-CM

## 2022-11-16 DIAGNOSIS — J189 Pneumonia, unspecified organism: Secondary | ICD-10-CM

## 2022-11-16 DIAGNOSIS — M549 Dorsalgia, unspecified: Secondary | ICD-10-CM

## 2022-11-16 DIAGNOSIS — I10 Essential (primary) hypertension: Secondary | ICD-10-CM

## 2022-11-16 DIAGNOSIS — Z1231 Encounter for screening mammogram for malignant neoplasm of breast: Secondary | ICD-10-CM

## 2022-11-16 NOTE — Patient Instructions
Team Members:  Dr. Priyanka Sharma, MD  Jaimie Heldstab, APRN  Jaice Digioia, RN  Julie French, RN    Contact information:   Office phone: [phone]  Office fax: [phone]  After hours phone: [phone]    Address:  [address], [address]  [place], [place] [address]

## 2022-11-16 NOTE — Progress Notes
11/16/2022    Shelley Powell is a 67 y.o. female.    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   STAGE: T3N0Mx, high grade metaplastic breast cancer     Surgeon:  Dr. Fredricka Bonine    Oncology History: 67 year old female who initially felt a mass in her right breast during the holiday's of 2009 and brought it to the attention of her PCP. She had a mammogram and then a ultrasound read as BIRAD 4. 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 She denies any nipple discharge, skin changes, fatigue, or any other complaints. She was then seen by Dr Fredricka Bonine and a 10 cm mass was found in her right breast. She underwent right MRM ( with TE placement ) and sentinel LN sampling on 05/21/08. Pathology showed 5.3cm x 4.8cm x 3.9cm poorly differentiated cancer, with chondroid and osseous differentiation. All margins were clear. No angiolymphatic invasion, 0/2 sentinel LNs were negative. ER/PR negative, Ki-67 80%, EGFR 100%, HER 2 negative by FISH. Adjuvant chemotherapy with Adriamycin and Cytoxan dose-dense was initiated on 06/19/2008 and completed 08/01/2008. Weekly Taxol 08/22/2008 and completed 11/07/2008. Has completed adjuvant XRT under the direction of Dr. Kendal Hymen Goins (01/10/2009).     PRESENT THERAPY: Observation     HPI  Shelley Powell presents today for follow up. She continues to follow with PCP locally.       Review of Systems   Constitutional:  Negative for fatigue and fever.   HENT:  Negative for trouble swallowing.    Eyes: Negative.  Negative for visual disturbance.   Respiratory:  Negative for cough, chest tightness and shortness of breath.    Cardiovascular:  Negative for chest pain.   Gastrointestinal:  Negative for abdominal pain, constipation, diarrhea, nausea and vomiting.   Genitourinary: Negative.  Negative for difficulty urinating.   Musculoskeletal:  Positive for arthralgias (hip, stable). Negative for back pain and neck stiffness.   Skin: Negative.  Negative for rash and wound.   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 Negative for dizziness, weakness and headaches.   Hematological: Negative.  Negative for adenopathy.   Psychiatric/Behavioral: Negative.  Negative for dysphoric mood. The patient is not nervous/anxious.        Current Medication List:          aspirin EC (ASPIR-LOW) 81 mg tablet Take one tablet by mouth daily.    glipiZIDE CR (GLUCOTROL XL) 10 mg tablet Take one tablet by mouth daily.    hyoscyamine sulfate (LEVSIN/SL) 0.125 mg sublingual tablet Place one tablet under tongue four times daily as needed for Cramps.    JARDIANCE 25 mg tablet Take one tablet by mouth daily.    loperamide (ANTI-DIARRHEAL (LOPERAMIDE)) 2 mg capsule Take one capsule by mouth four times daily as needed for Diarrhea.    losartan-hydrochlorothiazide (HYZAAR) 50-12.5 mg tablet Take 12.5 tablets by mouth daily.    omeprazole DR (PRILOSEC) 40 mg capsule Take one capsule by mouth daily.    pravastatin (PRAVACHOL) 40 mg tablet Take one tablet by mouth daily.    psyllium husk (with sugar) (METAMUCIL (WITH SUGAR)) 3.4 gram packet Take one packet by mouth twice daily.    SITagliptin phosphate (JANUVIA) 50 mg tablet          Vitals:    11/16/22 1421   BP: 113/70   Pulse: 90  Temp: 36.4 ?C (97.5 ?F)   Resp: 16   SpO2: 97%         Objective     Body mass index is 33.15 kg/m?Marland Kitchen     Pain Rating:   0       Pain Addressed:  N/A    ECOG performance status is 0, Fully active, able to carry on all pre-disease performance without restriction.Marland Kitchen    Physical Exam  Vitals reviewed.   Constitutional:       General: She is not in acute distress.     Appearance: She is well-developed. She is not ill-appearing or toxic-appearing.   HENT:      Head: Normocephalic and atraumatic.   Eyes:      General: No scleral icterus.        Right eye: No discharge.         Left eye: No discharge.      Conjunctiva/sclera: Conjunctivae normal. Pupils: Pupils are equal, round, and reactive to light.   Neck:      Vascular: No JVD.   Cardiovascular:      Rate and Rhythm: Normal rate and regular rhythm.      Heart sounds: Normal heart sounds. No murmur heard.  Pulmonary:      Effort: Pulmonary effort is normal. No respiratory distress.      Breath sounds: Normal breath sounds. No wheezing.   Chest:       Abdominal:      General: There is no distension.   Musculoskeletal:         General: No tenderness. Normal range of motion.      Cervical back: Normal range of motion and neck supple.   Lymphadenopathy:      Cervical: No cervical adenopathy (left, 1cm).      Upper Body:      Right upper body: No supraclavicular adenopathy.      Left upper body: No supraclavicular adenopathy.   Skin:     General: Skin is warm and dry.      Coloration: Skin is not pale.      Findings: No erythema or rash.   Neurological:      Mental Status: She is alert and oriented to person, place, and time.      Cranial Nerves: No cranial nerve deficit.      Sensory: No sensory deficit.      Motor: No weakness.      Coordination: Coordination normal.   Psychiatric:         Behavior: Behavior normal.         Thought Content: Thought content normal.         Judgment: Judgment normal.          Breast Imaging:  Left Screening Mammogram 11/05/20:  BIRAD 1  Left Screening Mammogram 11/11/21:  BIRAD 2   Left Screening Mammogram 11/16/22: BIRAD 1         Assessment and Plan:   1. T3N0M0 Metaplastic right breast CA; S/P mastectomy/TE. Completed adjuvant A/C followed by Weekly Taxol x 12 cycles (completed 11/07/2008). Patient then completed radiation therapy.     2. BRCA and BART testing negative.     3. DM: Currently on glipizide and jardiance. Continue to follow up locally.     4.  Colonoscopy:  Done 08/2015, repeat in 10 years.     5. 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 She is on a PPI and follows with GI in [place].  She reports most  recent EGD was normal.     6.Current mammogram BIRAD 1, repeat in 1 year.     7. Nausea/vomiting/diarrhea intermittent. She is following locally with PCP and recently saw GI.   She reports she has had recent scans.       RTC in 1 year with left mammogram      Francie Massing, APRN-NP    Collaborating MD: Osborn Coho

## 2022-11-17 DIAGNOSIS — Z1231 Encounter for screening mammogram for malignant neoplasm of breast: Secondary | ICD-10-CM

## 2023-05-05 ENCOUNTER — Encounter: Admit: 2023-05-05 | Discharge: 2023-05-05 | Payer: PRIVATE HEALTH INSURANCE

## 2023-05-16 ENCOUNTER — Encounter: Admit: 2023-05-16 | Discharge: 2023-05-16 | Payer: PRIVATE HEALTH INSURANCE

## 2023-11-22 ENCOUNTER — Encounter: Admit: 2023-11-22 | Discharge: 2023-11-22 | Payer: MEDICARE

## 2023-11-22 ENCOUNTER — Ambulatory Visit: Admit: 2023-11-22 | Discharge: 2023-11-22 | Payer: MEDICARE

## 2023-11-22 DIAGNOSIS — C50911 Malignant neoplasm of unspecified site of right female breast: Principal | ICD-10-CM

## 2023-11-22 DIAGNOSIS — Z1231 Encounter for screening mammogram for malignant neoplasm of breast: Secondary | ICD-10-CM

## 2023-11-22 NOTE — Progress Notes
 11/22/2023    Shelley Powell is a 68 y.o. female.    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   STAGE: T3N0Mx, high grade metaplastic breast cancer     Surgeon:  Dr. Signe    Oncology History: 68 year old female who initially felt a mass in her right breast during the holiday's of 2009 and brought it to the attention of her PCP. She had a mammogram and then a ultrasound read as BIRAD 4. 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 She denies any nipple discharge, skin changes, fatigue, or any other complaints. She was then seen by Dr Signe and a 10 cm mass was found in her right breast. She underwent right MRM ( with TE placement ) and sentinel LN sampling on 05/21/08. Pathology showed 5.3cm x 4.8cm x 3.9cm poorly differentiated cancer, with chondroid and osseous differentiation. All margins were clear. No angiolymphatic invasion, 0/2 sentinel LNs were negative. ER/PR negative, Ki-67 80%, EGFR 100%, HER 2 negative by FISH. Adjuvant chemotherapy with Adriamycin and Cytoxan dose-dense was initiated on 06/19/2008 and completed 08/01/2008. Weekly Taxol 08/22/2008 and completed 11/07/2008. Has completed adjuvant XRT under the direction of Dr. Consuelo Goins (01/10/2009).     PRESENT THERAPY: Observation     HPI  Shelley Powell presents today for follow up. She presents with her husband. She continues to follow with PCP locally. She is scheduled to see him on Friday and Q68mo.      Review of Systems   Constitutional:  Negative for fatigue and fever.   HENT:  Negative for trouble swallowing.    Eyes: Negative.  Negative for visual disturbance.   Respiratory:  Negative for cough, chest tightness and shortness of breath.    Cardiovascular:  Negative for chest pain.   Gastrointestinal:  Negative for abdominal pain, constipation, diarrhea, nausea and vomiting.   Genitourinary: Negative.  Negative for difficulty urinating. Musculoskeletal:  Positive for arthralgias (hip, stable). Negative for back pain and neck stiffness.   Skin: Negative.  Negative for rash and wound.   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 Negative for dizziness, weakness and headaches.   Hematological: Negative.  Negative for adenopathy.   Psychiatric/Behavioral: Negative.  Negative for dysphoric mood. The patient is not nervous/anxious.        Current Medication List:          aspirin EC (ASPIR-LOW) 81 mg tablet Take one tablet by mouth daily.    ezetimibe (ZETIA) 10 mg tablet Take one tablet by mouth daily.    glipiZIDE CR (GLUCOTROL XL) 10 mg tablet Take one tablet by mouth daily.    hyoscyamine  sulfate (LEVSIN /SL) 0.125 mg sublingual tablet Place one tablet under tongue four times daily as needed for Cramps.    JARDIANCE 25 mg tablet Take one tablet by mouth daily.    loperamide  (ANTI-DIARRHEAL (LOPERAMIDE )) 2 mg capsule Take one capsule by mouth four times daily as needed for Diarrhea.    losartan-hydrochlorothiazide (HYZAAR) 50-12.5 mg tablet Take 12.5 tablets by mouth daily.    omeprazole DR (PRILOSEC) 40 mg capsule Take one capsule by mouth daily.    pravastatin (PRAVACHOL) 40 mg tablet Take one tablet by mouth daily.    psyllium husk (with sugar) (METAMUCIL (WITH SUGAR)) 3.4 gram packet Take one packet by mouth twice daily.    SITagliptin phosphate (JANUVIA)  50 mg tablet          Vitals:    11/22/23 1037   BP: 108/64   Pulse: 80   Temp: 36.4 ?C (97.5 ?F)   Resp: 17   SpO2: 97%           Objective     Body mass index is 32.3 kg/m?[name]     Pain Rating:   0       Pain Addressed:  N/A    ECOG performance status is 0, Fully active, able to carry on all pre-disease performance without restriction.[name]    Physical Exam  Vitals reviewed.   Constitutional:       General: She is not in acute distress.     Appearance: She is well-developed. She is not ill-appearing or toxic-appearing.   HENT:      Head: Normocephalic and atraumatic.   Eyes: General: No scleral icterus.        Right eye: No discharge.         Left eye: No discharge.      Conjunctiva/sclera: Conjunctivae normal.      Pupils: Pupils are equal, round, and reactive to light.   Neck:      Vascular: No JVD.   Cardiovascular:      Rate and Rhythm: Normal rate and regular rhythm.      Heart sounds: Normal heart sounds. No murmur heard.  Pulmonary:      Effort: Pulmonary effort is normal. No respiratory distress.      Breath sounds: Normal breath sounds. No wheezing.   Chest:       Abdominal:      General: There is no distension.   Musculoskeletal:         General: No tenderness. Normal range of motion.      Cervical back: Normal range of motion and neck supple.   Lymphadenopathy:      Cervical: No cervical adenopathy (left, 1cm).      Upper Body:      Right upper body: No supraclavicular adenopathy.      Left upper body: No supraclavicular adenopathy.   Skin:     General: Skin is warm and dry.      Coloration: Skin is not pale.      Findings: No erythema or rash.   Neurological:      Mental Status: She is alert and oriented to person, place, and time.      Cranial Nerves: No cranial nerve deficit.      Sensory: No sensory deficit.      Motor: No weakness.      Coordination: Coordination normal.   Psychiatric:         Behavior: Behavior normal.         Thought Content: Thought content normal.         Judgment: Judgment normal.          Breast Imaging:  Left Screening Mammogram 11/05/20:  BIRAD 1  Left Screening Mammogram 11/11/21:  BIRAD 2   Left Screening Mammogram 11/16/22: BIRAD 1  Left Screening Mammogram 11/22/23:  BIRAD 1       Assessment and Plan:   1. T3N0M0 Metaplastic right breast CA; S/P mastectomy/TE. Completed adjuvant A/C followed by Weekly Taxol x 12 cycles (completed 11/07/2008). Patient then completed radiation therapy (completed 01/10/2009). Screening mammogram is negative.     2. BRCA and BART testing negative.     3. DM: Currently on glipizide, jardiance, and Januvia. Continue to  follow up locally.     4.  Colonoscopy:  Done 08/2015, repeat in 10 years.     5. 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 She is on a PPI and follows with GI in [place].  She reports most recent EGD was normal. She believes last EGD was a few years ago, will follow up with PCP.       RTC in 1 year with left mammogram      Mady CHRISTELLA Skeeter, APRN-NP    Collaborating MD: Arlin Pottier

## 2023-11-22 NOTE — Patient Instructions
Team Members:  Dr. Osborn Coho, MD  Marylene Land, APRN  Misty Stanley Lovett Coffin, RN  Freeman Caldron, RN    Contact information:   Office phone: [phone]  Office fax: [phone]  After hours phone: [phone]    Address:  [address] [place], [address]  [place], [place] [address] Team Members:  Dr. Osborn Coho, MD  Marylene Land, APRN  Misty Stanley Karren Newland, RN  Freeman Caldron, RN    Contact information:   Office phone: [phone]  Office fax: [phone]  After hours phone: [phone]    Address:  [address] [place], [address]  [place], [place] [address]

## 2023-12-30 ENCOUNTER — Encounter: Admit: 2023-12-30 | Discharge: 2023-12-30 | Payer: MEDICARE

## 2024-01-11 ENCOUNTER — Encounter: Admit: 2024-01-11 | Discharge: 2024-01-11 | Payer: MEDICARE

## 2024-01-16 ENCOUNTER — Encounter: Admit: 2024-01-16 | Discharge: 2024-01-16 | Payer: MEDICARE

## 2024-01-17 ENCOUNTER — Encounter: Admit: 2024-01-17 | Discharge: 2024-01-17 | Payer: MEDICARE

## 2024-01-17 ENCOUNTER — Ambulatory Visit: Admit: 2024-01-17 | Discharge: 2024-01-18 | Payer: MEDICARE

## 2024-01-17 VITALS — BP 112/61 | HR 78 | Ht 67.992 in

## 2024-01-17 DIAGNOSIS — M5412 Radiculopathy, cervical region: Principal | ICD-10-CM

## 2024-01-17 DIAGNOSIS — M5020 Other cervical disc displacement, unspecified cervical region: Secondary | ICD-10-CM

## 2024-01-17 NOTE — Progress Notes
 [facility] HISTORY AND PHYSICAL    Chief Complaint:   Chief Complaint   Patient presents with    Follow Up     neck, left greater than right       Subjective     HISTORY OF PRESENT ILLNESS:   Shelley Powell is a 68 y.o. 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 who presents for evaluation.Patient is presenting with a history of neck pain that is radiating mostly into her left shoulder and down her left arm.  She reports insidious onset.  Initially she had very severe pain, she can turn her head side-to-side his pain was mostly in the neck and started radiating down the left arm into the left hand.  She works at [employer] so immediately got into physical therapy.  She reports great improvement with physical therapy, she is feeling about 80% better after taking ibuprofen and working with physical therapy.  Unfortunately this morning she woke up and had a small reoccurrence of the pain and stiffness as mostly on the left side of her neck.  She describes it as constant nature, sharp shooting, pins-and-needles.  Worse when she is turning her head side-to-side or looking up and down.  She has a history of lumbar disc herniations has had epidurals down in her low back.  She ended up getting an MRI of her C-spine which showed a C7-T1 left paracentral disc herniation.             Lilana Blasko denies any recent fevers, chills, infection, antibiotics, bowel or bladder incontinence, saddle anesthesia, bleeding issues, or recent anticoagulant.     ROS:   A 10 point review of systems is negative except for what is noted in the HPI above.    Past Medical History:  Past Medical History:    Acid reflux    Allergy    Arthritis    Back pain    Breast cancer, right (HCC)    Degenerative disc disease, cervical    Degenerative disc disease, lumbar    Dyslipidemia    Essential hypertension    Generalized headaches    GERD (gastroesophageal reflux disease)    History of radiation therapy    Hx antineoplastic chemo    Hyperlipemia    Joint pain    Nephrolithiasis    Other and unspecified hyperlipidemia    Other malignant neoplasm without specification of site    Personal history of irradiation    Pneumonia    Seasonal rhinitis    Type II diabetes mellitus (CMS-HCC)    Vision decreased       Family History:  Family History   Problem Relation Name Age of Onset    Cancer Mother Addie 40    Heart Failure Father Prentice     Other Father Prentice         COPD    Heart Disease Father Prentice     Hypertension Father Prentice Angus Brother Ron 60  Cancer Brother Ron     Cancer-Prostate Brother [place] 60    Cancer Brother [place]     Heart Attack Father Fred     Heart problem Father Fred         Aortic valve bad       Social History:  Lives in [place] [id number]    Social History     Socioeconomic History    Marital status: Married   Occupational History    Occupation: Education officer, environmental   Tobacco Use    Smoking status: Never    Smokeless tobacco: Never   Substance and Sexual Activity    Alcohol use: Never    Drug use: Never    Sexual activity: Yes     Partners: Male     Birth control/protection: Pill, Surgical       Allergies:  Allergies[1]    Medications:  Current Medications[2]    Physical examination:   There were no vitals taken for this visit.  Pain Score: Two   Telehealth Patient Reported Vitals       Row Name 01/17/24 0947                Pain Score Two              Gen: Alert & Oriented X 3  HEENT: EOMI  Neck: Supple, no elevated JVP  Heart: Extremities well perfused  Lungs: non labored breathing  Abdomen: Soft, non-tender, non-distended  Skin: no gross lesions appreciated  Ext: purposeful movement of extremities     UPPER EXTREMITIES      Adequate cervical range of motion with flexion, extension, side bending and rotation.    Full AROM with bilateral shoulder forward flexion, abduction, internal and external rotation.      No tenderness to palpation through the cervical spinous processes, SCM, splenius capitis, scalenes, paraspinal musculature, trapezius, rhomboids.      Spurlings positive on the left.         DIAGNOSTICS:  MRI C-spine 12/28/2023: Moderate to large left posterior paracentral foraminal disc protrusion at C6-7.  Additional degenerative changes throughout the cervical spine.      Last Cr and LFT's:  Creatinine   Date Value Ref Range Status   07/08/2010 0.47 0.4 - 1.00 MG/DL Final     AST (SGOT)   Date Value Ref Range Status   07/08/2010 19 7 - 40 U/L Final     ALT (SGPT)   Date Value Ref Range Status   07/08/2010 21 7 - 56 U/L Final     Alk Phosphatase   Date Value Ref Range Status   07/08/2010 57 25 - 110 U/L Final     Total Bilirubin   Date Value Ref Range Status   07/08/2010 1.5 (H) 0.3 - 1.2 MG/DL Final            Assessment:  The pain complaints are most likely due to:  1. Cervical radiculopathy    2. Cervical herniated disc      Shelley Powell is a 68 y.o. 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 who presents for evaluation of pain.    Plan:  1.  Lifestyle modification.  Continue current activities as tolerated.    2.  Medication.    - No new medications at this time.  3.  Therapy.    - Plan is to continue current physical therapy program.  She seems to be doing all the right stretches and exercises and modalities and she is making good progress.  I would continue to work with them.  4.  Interventions.  No interventions planned at this time.  Can consider a C7-T1 epidural steroid injection if conservative measures stop helping.  5.  Diagnostics.  No new imaging to be ordered at this time.  6.  Follow-up.  Patient will follow-up as needed.    Risks/benefits of all pharmacologic and interventional treatments discussed and questions answered.     Thank you for this kind referral for consultation. Please feel free to contact me with any questions or concerns.               [1]   Allergies  Allergen Reactions    Zithromax [Azithromycin] SHORTNESS OF BREATH   [2]   Current Outpatient Medications:     aspirin EC (ASPIR-LOW) 81 mg tablet, Take one tablet by mouth daily., Disp: , Rfl:     ezetimibe (ZETIA) 10 mg tablet, Take one tablet by mouth daily., Disp: , Rfl:     glipiZIDE CR (GLUCOTROL XL) 10 mg tablet, Take one tablet by mouth daily., Disp: , Rfl:     hyoscyamine  sulfate (LEVSIN /SL) 0.125 mg sublingual tablet, Place one tablet under tongue four times daily as needed for Cramps., Disp: 120 tablet, Rfl: 11    JARDIANCE 25 mg tablet, Take one tablet by mouth daily., Disp: , Rfl:     loperamide  (ANTI-DIARRHEAL (LOPERAMIDE )) 2 mg capsule, Take one capsule by mouth four times daily as needed for Diarrhea., Disp: 120 capsule, Rfl: 11    losartan-hydrochlorothiazide (HYZAAR) 50-12.5 mg tablet, Take 12.5 tablets by mouth daily., Disp: , Rfl:     omeprazole DR (PRILOSEC) 40 mg capsule, Take one capsule by mouth daily., Disp: , Rfl:     pravastatin (PRAVACHOL) 40 mg tablet, Take one tablet by mouth daily., Disp: , Rfl:     psyllium husk (with sugar) (METAMUCIL (WITH SUGAR)) 3.4 gram packet, Take one packet by mouth twice daily., Disp: 60 packet, Rfl: 11    SITagliptin phosphate (JANUVIA) 50 mg tablet, , Disp: , Rfl:

## 2024-04-06 ENCOUNTER — Encounter: Admit: 2024-04-06 | Discharge: 2024-04-06 | Payer: MEDICARE
